# Patient Record
Sex: Female | Born: 1955 | Race: White | Hispanic: No | Marital: Married | Smoking: Never smoker
Health system: Southern US, Community
[De-identification: ages and names within clinical notes are randomized; demographics above are authoritative.]

## PROBLEM LIST (undated history)

## (undated) DIAGNOSIS — J45909 Unspecified asthma, uncomplicated: Secondary | ICD-10-CM

## (undated) DIAGNOSIS — G5793 Unspecified mononeuropathy of bilateral lower limbs: Secondary | ICD-10-CM

## (undated) DIAGNOSIS — M8580 Other specified disorders of bone density and structure, unspecified site: Secondary | ICD-10-CM

## (undated) DIAGNOSIS — T07XXXA Unspecified multiple injuries, initial encounter: Secondary | ICD-10-CM

## (undated) DIAGNOSIS — M0520 Rheumatoid vasculitis with rheumatoid arthritis of unspecified site: Secondary | ICD-10-CM

## (undated) HISTORY — DX: Unspecified multiple injuries, initial encounter: T07.XXXA

## (undated) HISTORY — DX: Unspecified mononeuropathy of bilateral lower limbs: G57.93

## (undated) HISTORY — DX: Other specified disorders of bone density and structure, unspecified site: M85.80

## (undated) HISTORY — DX: Unspecified asthma, uncomplicated: J45.909

## (undated) HISTORY — DX: Rheumatoid vasculitis with rheumatoid arthritis of unspecified site: M05.20

---

## 1999-09-28 ENCOUNTER — Other Ambulatory Visit: Admission: RE | Admit: 1999-09-28 | Discharge: 1999-09-28 | Payer: Self-pay | Admitting: Family Medicine

## 2003-02-19 ENCOUNTER — Other Ambulatory Visit: Admission: RE | Admit: 2003-02-19 | Discharge: 2003-02-19 | Payer: Self-pay | Admitting: Family Medicine

## 2004-04-22 ENCOUNTER — Ambulatory Visit: Payer: Self-pay | Admitting: Family Medicine

## 2004-11-02 ENCOUNTER — Ambulatory Visit: Payer: Self-pay | Admitting: Family Medicine

## 2005-03-03 ENCOUNTER — Ambulatory Visit: Payer: Self-pay | Admitting: Family Medicine

## 2014-07-06 DIAGNOSIS — M8580 Other specified disorders of bone density and structure, unspecified site: Secondary | ICD-10-CM

## 2014-07-06 HISTORY — DX: Other specified disorders of bone density and structure, unspecified site: M85.80

## 2014-11-07 DIAGNOSIS — K219 Gastro-esophageal reflux disease without esophagitis: Secondary | ICD-10-CM

## 2014-11-07 DIAGNOSIS — J4540 Moderate persistent asthma, uncomplicated: Secondary | ICD-10-CM | POA: Insufficient documentation

## 2014-11-07 DIAGNOSIS — J3089 Other allergic rhinitis: Secondary | ICD-10-CM | POA: Insufficient documentation

## 2014-12-17 ENCOUNTER — Encounter: Payer: Self-pay | Admitting: Allergy and Immunology

## 2014-12-17 ENCOUNTER — Ambulatory Visit (INDEPENDENT_AMBULATORY_CARE_PROVIDER_SITE_OTHER): Payer: BLUE CROSS/BLUE SHIELD | Admitting: Allergy and Immunology

## 2014-12-17 VITALS — BP 138/80 | HR 80 | Resp 24 | Ht 62.6 in | Wt 164.5 lb

## 2014-12-17 DIAGNOSIS — J3089 Other allergic rhinitis: Secondary | ICD-10-CM

## 2014-12-17 DIAGNOSIS — J4520 Mild intermittent asthma, uncomplicated: Secondary | ICD-10-CM | POA: Diagnosis not present

## 2014-12-17 DIAGNOSIS — K219 Gastro-esophageal reflux disease without esophagitis: Secondary | ICD-10-CM | POA: Diagnosis not present

## 2014-12-17 MED ORDER — OMEPRAZOLE 40 MG PO CPDR: DELAYED_RELEASE_CAPSULE | ORAL | Status: AC

## 2014-12-17 NOTE — Patient Instructions (Signed)
1. Continue omeprazole 40mg  one tablet one time per day  2. Continue Ventolin HFA two puffs every 4-6 hours if needed.  3. Continue action plan for asthma flare with Qvar 80 three inhalations three times per day.  4. Continue nasacort 1-2 sprays each nostril one time per day.

## 2014-12-17 NOTE — Progress Notes (Signed)
Ann Leon  Follow-up Note    Subjective:   Ann Leon is a 59 y.o. female who returns to the [facility] in re-evaluation of the following:  HPI Comments: Ann Leon returns stating that her asthma has been non existent the past year with minimal requirement for a SABA and no need to activate an action plan with Qvar. She has no problem with her nose while intermittently using Nasacort. Her reflux has been under good control with omeprazole 40 mg daily. She has been on prednisone for months for a flare of her RA and it sounds like she has stress fractures in her left foot. She is using a diphosphenate for her osteopenia. She did obtain the flu vaccine    Current outpatient prescriptions:  .  albuterol (VENTOLIN HFA) 108 (90 BASE) MCG/ACT inhaler, Inhale 2 puffs into the lungs every 4 (four) hours as needed (cough or wheeze)., Disp: , Rfl:  .  alendronate (FOSAMAX) 70 MG tablet, Take 70 mg by mouth once a week., Disp: , Rfl: 0 .  beclomethasone (QVAR) 80 MCG/ACT inhaler, Inhale 2 puffs into the lungs 2 (two) times daily., Disp: , Rfl:  .  CALCIUM-VITAMIN D PO, Take by mouth., Disp: , Rfl:  .  folic acid (FOLVITE) 967 MCG tablet, Take 400 mcg by mouth daily., Disp: , Rfl:  .  hydroxychloroquine (PLAQUENIL) 200 MG tablet, Take 300 mg by mouth daily., Disp: , Rfl:  .  omeprazole (PRILOSEC OTC) 20 MG tablet, Take 20 mg by mouth every morning., Disp: , Rfl:  .  predniSONE (DELTASONE) 5 MG tablet, Take 5 mg by mouth daily., Disp: , Rfl: 0 .  traMADol (ULTRAM) 50 MG tablet, take 1 tablet by mouth three times a day if needed for pain, Disp: , Rfl: 0 .  Triamcinolone Acetonide (NASACORT AQ NA), Place 2 sprays into the nose daily. , Disp: , Rfl:  .  InFLIXimab (REMICADE IV), Inject into the vein every 8 (eight) weeks., Disp: , Rfl:  .  omeprazole (PRILOSEC) 40 MG capsule, Take one capsule once daily before breakfast as directed.,  Disp: 30 capsule, Rfl: 11  Meds ordered this encounter  Medications  . omeprazole (PRILOSEC) 40 MG capsule    Sig: Take one capsule once daily before breakfast as directed.    Dispense:  30 capsule    Refill:  11    Past Medical History  Diagnosis Date  . Neuropathy of both feet (Ann Leon)   . Osteopenia may 2016  . Fractures     hairline fractures of foot  . Asthma     History reviewed. No pertinent past surgical history.  Allergies no known allergies  Review of Systems  Constitutional: Negative for fever, chills, weight loss and malaise/fatigue.  HENT: Negative for congestion, ear discharge, ear pain, hearing loss, nosebleeds, sore throat and tinnitus.   Eyes: Negative for blurred vision, pain, discharge and redness.  Respiratory: Negative for cough, hemoptysis, sputum production, shortness of breath, wheezing and stridor.   Cardiovascular: Negative for chest pain, palpitations and leg swelling.  Gastrointestinal: Negative for heartburn, nausea, vomiting and abdominal pain.  Musculoskeletal: Positive for joint pain. Negative for myalgias.  Skin: Negative for itching and rash.  Neurological: Negative for dizziness and headaches.     Objective:   Filed Vitals:   12/17/14 1533  BP: 138/80  Pulse: 80  Resp: 24    Physical Exam  Constitutional: She is well-developed, well-nourished, and  in no distress. No distress.  HENT:  Head: Normocephalic and atraumatic. Head is without right periorbital erythema and without left periorbital erythema.  Right Ear: Tympanic membrane, external ear and ear canal normal. No drainage. No foreign bodies. Tympanic membrane is not injected, not scarred, not perforated, not erythematous, not retracted and not bulging. No middle ear effusion.  Left Ear: Tympanic membrane, external ear and ear canal normal. No drainage. No foreign bodies. Tympanic membrane is not injected, not scarred, not perforated, not erythematous, not retracted and not bulging.   No middle ear effusion.  Nose: Nose normal. No mucosal edema, rhinorrhea, nose lacerations, sinus tenderness, nasal deformity, septal deviation or nasal septal hematoma. No epistaxis.  Mouth/Throat: Oropharynx is clear and moist and mucous membranes are normal. No oropharyngeal exudate, posterior oropharyngeal edema, posterior oropharyngeal erythema or tonsillar abscesses.  Eyes: Conjunctivae and lids are normal. Pupils are equal, round, and reactive to light. Right eye exhibits no discharge and no exudate. No foreign body present in the right eye. Left eye exhibits no discharge and no exudate. No foreign body present in the left eye. Right conjunctiva is not injected. Right conjunctiva has no hemorrhage. Left conjunctiva is not injected. Left conjunctiva has no hemorrhage. No scleral icterus.  Neck: No tracheal tenderness present. No tracheal deviation present. No thyromegaly present.  Cardiovascular: Normal rate, regular rhythm, S1 normal, S2 normal and normal heart sounds.  Exam reveals no gallop and no friction rub.   No murmur heard. Pulmonary/Chest: Effort normal. No respiratory distress. She has no wheezes. She has no rhonchi. She has no rales. She exhibits no tenderness.  Lymphadenopathy:    She has no cervical adenopathy.  Skin: No purpura and no rash noted. Rash is not macular, not maculopapular, not nodular, not pustular, not vesicular and not urticarial. She is not diaphoretic. No cyanosis or erythema. No pallor. Nails show no clubbing.  Psychiatric: Mood and affect normal.    Diagnostics:    Spirometry was performed and demonstrated an FEV1 of 1.99 at 84 % of predicted.  The patient had an Asthma Control Test with the following results: ACT Total Score: 23.    Assessment and Plan:   1. Mild intermittent asthma, uncomplicated   2. Gastroesophageal reflux disease, esophagitis presence not specified   3. Other allergic rhinitis      1. Continue omeprazole 40mg  one tablet one  time per day  2. Continue Ventolin HFA two puffs every 4-6 hours if needed.  3. Continue action plan for asthma flare with Qvar 80 three inhalations three times per day.  4. Continue nasacort 1-2 sprays each nostril one time per day.  Ann Leon has done so well that I don't think there is a reason for her to follow up in this clinic unless something unexpected occurs. She is certainly welcome to return in the future and she can have the remainder of her medical care delivered by Dr. Theodoro Kos, [place]

## 2015-11-12 DIAGNOSIS — E876 Hypokalemia: Secondary | ICD-10-CM | POA: Diagnosis not present

## 2015-11-12 DIAGNOSIS — C7B02 Secondary carcinoid tumors of liver: Secondary | ICD-10-CM | POA: Diagnosis not present

## 2015-11-12 DIAGNOSIS — M489 Spondylopathy, unspecified: Secondary | ICD-10-CM | POA: Diagnosis not present

## 2015-11-25 DIAGNOSIS — E340 Carcinoid syndrome: Secondary | ICD-10-CM | POA: Diagnosis not present

## 2015-11-25 DIAGNOSIS — C7B02 Secondary carcinoid tumors of liver: Secondary | ICD-10-CM | POA: Diagnosis not present

## 2016-03-17 DIAGNOSIS — E340 Carcinoid syndrome: Secondary | ICD-10-CM | POA: Diagnosis not present

## 2016-03-17 DIAGNOSIS — C7B02 Secondary carcinoid tumors of liver: Secondary | ICD-10-CM | POA: Diagnosis not present

## 2016-03-17 DIAGNOSIS — C7A00 Malignant carcinoid tumor of unspecified site: Secondary | ICD-10-CM | POA: Diagnosis not present

## 2016-07-07 DIAGNOSIS — C7B02 Secondary carcinoid tumors of liver: Secondary | ICD-10-CM | POA: Diagnosis not present

## 2016-07-07 DIAGNOSIS — E340 Carcinoid syndrome: Secondary | ICD-10-CM

## 2016-07-07 DIAGNOSIS — C7A00 Malignant carcinoid tumor of unspecified site: Secondary | ICD-10-CM | POA: Diagnosis not present

## 2016-09-29 DIAGNOSIS — M069 Rheumatoid arthritis, unspecified: Secondary | ICD-10-CM | POA: Diagnosis not present

## 2016-09-29 DIAGNOSIS — C7B03 Secondary carcinoid tumors of bone: Secondary | ICD-10-CM | POA: Diagnosis not present

## 2016-09-29 DIAGNOSIS — C7A00 Malignant carcinoid tumor of unspecified site: Secondary | ICD-10-CM | POA: Diagnosis not present

## 2016-09-29 DIAGNOSIS — C7B02 Secondary carcinoid tumors of liver: Secondary | ICD-10-CM | POA: Diagnosis not present

## 2016-09-29 DIAGNOSIS — Z79899 Other long term (current) drug therapy: Secondary | ICD-10-CM | POA: Diagnosis not present

## 2016-10-20 DIAGNOSIS — C7B02 Secondary carcinoid tumors of liver: Secondary | ICD-10-CM | POA: Diagnosis not present

## 2016-10-20 DIAGNOSIS — C7A00 Malignant carcinoid tumor of unspecified site: Secondary | ICD-10-CM | POA: Diagnosis not present

## 2016-10-20 DIAGNOSIS — C7B03 Secondary carcinoid tumors of bone: Secondary | ICD-10-CM | POA: Diagnosis not present

## 2016-12-21 DIAGNOSIS — E611 Iron deficiency: Secondary | ICD-10-CM | POA: Diagnosis not present

## 2016-12-21 DIAGNOSIS — J189 Pneumonia, unspecified organism: Secondary | ICD-10-CM | POA: Diagnosis not present

## 2016-12-21 DIAGNOSIS — M069 Rheumatoid arthritis, unspecified: Secondary | ICD-10-CM | POA: Diagnosis not present

## 2016-12-21 DIAGNOSIS — C7A00 Malignant carcinoid tumor of unspecified site: Secondary | ICD-10-CM | POA: Diagnosis not present

## 2016-12-21 DIAGNOSIS — C7B02 Secondary carcinoid tumors of liver: Secondary | ICD-10-CM | POA: Diagnosis not present

## 2017-01-12 DIAGNOSIS — M069 Rheumatoid arthritis, unspecified: Secondary | ICD-10-CM | POA: Diagnosis not present

## 2017-01-12 DIAGNOSIS — C7A00 Malignant carcinoid tumor of unspecified site: Secondary | ICD-10-CM | POA: Diagnosis not present

## 2017-01-12 DIAGNOSIS — C7B02 Secondary carcinoid tumors of liver: Secondary | ICD-10-CM | POA: Diagnosis not present

## 2017-02-23 DIAGNOSIS — C7B02 Secondary carcinoid tumors of liver: Secondary | ICD-10-CM | POA: Diagnosis not present

## 2017-02-23 DIAGNOSIS — C7A00 Malignant carcinoid tumor of unspecified site: Secondary | ICD-10-CM | POA: Diagnosis not present

## 2017-02-23 DIAGNOSIS — D509 Iron deficiency anemia, unspecified: Secondary | ICD-10-CM | POA: Diagnosis not present

## 2017-02-23 DIAGNOSIS — E876 Hypokalemia: Secondary | ICD-10-CM | POA: Diagnosis not present

## 2017-04-06 DIAGNOSIS — C7B02 Secondary carcinoid tumors of liver: Secondary | ICD-10-CM | POA: Diagnosis not present

## 2017-04-06 DIAGNOSIS — D509 Iron deficiency anemia, unspecified: Secondary | ICD-10-CM | POA: Diagnosis not present

## 2017-04-06 DIAGNOSIS — J019 Acute sinusitis, unspecified: Secondary | ICD-10-CM | POA: Diagnosis not present

## 2017-04-06 DIAGNOSIS — C7A00 Malignant carcinoid tumor of unspecified site: Secondary | ICD-10-CM | POA: Diagnosis not present

## 2017-04-06 DIAGNOSIS — M069 Rheumatoid arthritis, unspecified: Secondary | ICD-10-CM | POA: Diagnosis not present

## 2017-04-27 ENCOUNTER — Other Ambulatory Visit: Payer: Self-pay | Admitting: Oncology

## 2017-04-27 DIAGNOSIS — C7B02 Secondary carcinoid tumors of liver: Secondary | ICD-10-CM

## 2017-05-23 ENCOUNTER — Encounter (HOSPITAL_COMMUNITY): Payer: Self-pay

## 2017-05-23 ENCOUNTER — Encounter (HOSPITAL_COMMUNITY)
Admission: RE | Admit: 2017-05-23 | Discharge: 2017-05-23 | Disposition: A | Discharge status: Home / Self Care | Payer: 59 | Source: Ambulatory Visit | Attending: Oncology | Admitting: Oncology

## 2017-05-23 DIAGNOSIS — C7B02 Secondary carcinoid tumors of liver: Secondary | ICD-10-CM | POA: Diagnosis present

## 2017-05-23 MED ORDER — GALLIUM GA 68 DOTATATE IV KIT
3.4000 | PACK | Freq: Once | INTRAVENOUS | Status: AC
  Administered 2017-05-23: 3.4 via INTRAVENOUS

## 2017-05-25 DIAGNOSIS — D509 Iron deficiency anemia, unspecified: Secondary | ICD-10-CM | POA: Diagnosis not present

## 2017-05-25 DIAGNOSIS — M069 Rheumatoid arthritis, unspecified: Secondary | ICD-10-CM | POA: Diagnosis not present

## 2017-05-25 DIAGNOSIS — C7A00 Malignant carcinoid tumor of unspecified site: Secondary | ICD-10-CM | POA: Diagnosis not present

## 2017-05-25 DIAGNOSIS — C7B02 Secondary carcinoid tumors of liver: Secondary | ICD-10-CM | POA: Diagnosis not present

## 2017-06-13 ENCOUNTER — Other Ambulatory Visit (HOSPITAL_COMMUNITY): Payer: Self-pay | Admitting: Diagnostic Radiology

## 2017-06-13 ENCOUNTER — Ambulatory Visit (HOSPITAL_COMMUNITY)
Admission: RE | Admit: 2017-06-13 | Discharge: 2017-06-13 | Disposition: A | Discharge status: Home / Self Care | Payer: 59 | Source: Ambulatory Visit | Attending: Diagnostic Radiology | Admitting: Diagnostic Radiology

## 2017-06-13 DIAGNOSIS — C7A8 Other malignant neuroendocrine tumors: Secondary | ICD-10-CM

## 2017-06-13 NOTE — Consult Note (Signed)
Chief Complaint: Patient with metastatic neuroendocrine tumor was for  evaluation Peptide receptor radiotherapy (PRRT) with QV956 DOTATATE (Lutathera).  Referring Physician(s):McCarty, [facility]   Patient Status: [facility] - Out-pt  History of Present Illness:Very pleasant 62 year old female accompanied by 2 daughters for evaluation for peptide receptor radiotherapy.  Metastatic hepatic disease was discovered on ER for abdominal pain in  August 2017.  Subsequent hepatic biopsy demonstrated well differentiated neuroendocrine tumor.  Patient had no symptoms of diarrhea flushing at time of presentation.  No primary tumor identified.  Hepatic lesions positive octreotide scan.   Patient was initially started on 20 mg octreotide monthly in September 2017.  This is exam advanced to 30 mg every 3 weeks in July 2018 with tumor progression by MRI imaging..  Neuroendocrine tumor progressed moderately by MR imaging.  Patient was started on Afinitor 10 mg q.day.  Some adverse effects including pneumonia pneumonia.. Everolimus dosage change to 5 mg q.day.  Patient received DOTATATE FDG PET scan on 05/23/2017 which showed positive neuroendocrine tumor liver and retromandibular neck on the RIGHT.  Multiple bone metastasis additionally.  Additional history includes rheumatoid arthritis which patient received prednisone daily.  Past Medical History:  Diagnosis Date  . Asthma   . Fractures    hairline fractures of foot  . Neuropathy of both feet   . Osteopenia may 2016  . Rheumatoid arteritis     No past surgical history on file.  Allergies: Patient has no known allergies.  Medications: Prior to Admission medications   Medication Sig Start Date End Date Taking? Authorizing Provider  albuterol (VENTOLIN HFA) 108 (90 BASE) MCG/ACT inhaler Inhale 2 puffs into the lungs every 4 (four) hours as needed (cough or wheeze).    [provider]  alendronate (FOSAMAX) 70 MG tablet Take 70 mg  by mouth once a week. 12/01/14   [provider]  beclomethasone (QVAR) 80 MCG/ACT inhaler Inhale 2 puffs into the lungs 2 (two) times daily.    [provider]  CALCIUM-VITAMIN D PO Take by mouth.    [provider]  folic acid (FOLVITE) 387 MCG tablet Take 400 mcg by mouth daily.    [provider]  hydroxychloroquine (PLAQUENIL) 200 MG tablet Take 300 mg by mouth daily.    [provider]  InFLIXimab (REMICADE IV) Inject into the vein every 8 (eight) weeks.    [provider]  omeprazole (PRILOSEC OTC) 20 MG tablet Take 20 mg by mouth every morning.    [provider]  omeprazole (PRILOSEC) 40 MG capsule Take one capsule once daily before breakfast as directed. 12/17/14   Kozlow, Donnamarie Poag, MD  predniSONE (DELTASONE) 5 MG tablet Take 5 mg by mouth daily. 12/15/14   [provider]  traMADol (ULTRAM) 50 MG tablet take 1 tablet by mouth three times a day if needed for pain 12/16/14   [provider]  Triamcinolone Acetonide (NASACORT AQ NA) Place 2 sprays into the nose daily.     [provider]     No family history on file.  Social History   Socioeconomic History  . Marital status: Married    Spouse name: Not on file  . Number of children: Not on file  . Years of education: Not on file  . Highest education level: Not on file  Occupational History  . Not on file  Social Needs  . Financial resource strain: Not on file  . Food insecurity:    Worry: Not on file  Inability: Not on file  . Transportation needs:    Medical: Not on file    Non-medical: Not on file  Tobacco Use  . Smoking status: Never Smoker  . Smokeless tobacco: Never Used  Substance and Sexual Activity  . Alcohol use: No  . Drug use: Not on file  . Sexual activity: Not on file  Lifestyle  . Physical activity:    Days per week: Not on file    Minutes per session: Not on file  . Stress: Not on file  Relationships  . Social  connections:    Talks on phone: Not on file    Gets together: Not on file    Attends religious service: Not on file    Active member of club or organization: Not on file    Attends meetings of clubs or organizations: Not on file    Relationship status: Not on file  Other Topics Concern  . Not on file  Social History Narrative  . Not on file    ECOG Status: 1 - Symptomatic but completely ambulatory  Review of Systems: A 12 point ROS discussed and pertinent positives are indicated in the HPI above.  All other systems are negative.  Review of Systems  Constitutional: Positive for fatigue.  Musculoskeletal: Positive for arthralgias.  Skin: Positive for wound.       Thin skin on hands with ecchymosis   Hematological: Bruises/bleeds easily.    Vital Signs: There were no vitals taken for this visit.  Physical Exam  Constitutional: She appears well-developed.  HENT:  Head: Normocephalic and atraumatic.  Cardiovascular: Normal rate, regular rhythm, normal heart sounds and intact distal pulses.  Pulmonary/Chest: Effort normal and breath sounds normal.  Abdominal: Soft. Bowel sounds are normal.  Lymphadenopathy:    She has no cervical adenopathy.  Skin: Capillary refill takes less than 2 seconds.  Psychiatric: She has a normal mood and affect. Her behavior is normal. Judgment and thought content normal.    Imaging: Nm Pet (netspot Ga 68 Dotatate) Skull Base To Mid Thigh  Result Date: 05/24/2017 CLINICAL DATA:  Metastatic neuroendocrine tumor. Progression of liver metastasis on MRI 04/05/2017. EXAM: NUCLEAR MEDICINE PET SKULL BASE TO THIGH TECHNIQUE: 3.4 mCi Ga 52 DOTATATE was injected intravenously. Full-ring PET imaging was performed from the skull base to thigh after the radiotracer. CT data was obtained and used for attenuation correction and anatomic localization. COMPARISON:  Octreotide scan 12/07/2015, MRI abdomen 04/05/2017 FINDINGS: NECK Mass within the RIGHT parapharyngeal  space has intense radiotracer activity with SUV max equal 32. Mass measures 2.5 by 2.8 cm on image 16/4. Incidental CT findings: None CHEST No radiotracer accumulation within mediastinal or hilar lymph nodes. No suspicious pulmonary nodules on the CT scan. Incidental CT finding:None ABDOMEN/PELVIS Intense radiotracer activity within multiple round hepatic lesions. Large lesion in the RIGHT hepatic lobe measures 5.7 cm on image 96/4 with SUV max equal 39.3. Lesion in the central liver adjacent to the hepatic veins with SUV max equal 32.3 measures 17 mm on image 85/4. Solitary lesion in the LEFT lateral hepatic lobe measures 17 mm on image 105/4 with SUV max equal 33.3. Approximately 9 lesions in the liver. There is no abnormal radiotracer activity in the bowel. No abnormal activity within abdominopelvic lymph nodes. Physiologic activity noted in the liver, spleen, adrenal glands and kidneys. 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 Lesion within the RIGHT humerus head. Example lesions include RIGHT humeral head lesion with SUV max equal 10.7 LEFT transverse process lesion at C1 SUV max equal 13 Anteromedial rib lesions at the costochondral junction. T7 lesion with SUV max equal 14.8 T12 lesion with SUV max equal 23.4. S1 lesion with SUV max equal 7.0 These skeletal metastatic lesions have subtle sclerotic changes on the CT portion. Incidental CT findings:No pathologic fracture. IMPRESSION: 1. Intense radiotracer activity within liver metastatic lesions consistent with well differentiated neuroendocrine tumor. Evidence of progression on comparison MRI. 2. Metastatic mass within the RIGHT parapharyngeal space likely represents a large metastatic lymph node 3. Widespread intensely radiotracer avid skeletal metastasis.  4. No lesion identified within the bowel. Electronically Signed   By: Suzy Bouchard M.D.   On: 05/24/2017 09:04      Labs:  CBC: No results for input(s): WBC, HGB, HCT, PLT in the last 8760 hours.   Outside: 04/27/17 HCT 34.8, Hgb -= 11.6 WBC 8.4 Plt = 244    COAGS: No results for input(s): INR, APTT in the last 8760 hours.  BMP: 04/27/17 Creatinine 0.7 Billi = 0.7 Ablumin = 3.9   No results for input(s): NA, K, CL, CO2, GLUCOSE, BUN, CALCIUM, CREATININE, GFRNONAA, GFRAA in the last 8760 hours.  Invalid input(s): CMP  LIVER FUNCTION TESTS: No results for input(s): BILITOT, AST, ALT, ALKPHOS, PROT, ALBUMIN in the last 8760 hours.  TUMOR MARKERS: No results for input(s): AFPTM, CEA, CA199, CHROMOGRNA in the last 8760 hours.  Assessment and Plan:  Appropriate candidate for peptide receptor radiotherapy.  Patient has widespread metastatic well-differentiated neuroendocrine tumor including liver metastasis, bone metastasis, and retro mandibular mass on RIGHT neck.  Patient has minimal carcinoid symptoms denying diarrhea or flushing.  Patient does have significant symptomatic rheumatoid arthritis control by prednisone daily.  Patient's blood counts, liver function, and renal function are within normal limits and appropriate for peptide receptor radiotherapy.  Patient will receive her last dose IM  Sandostatin the first week of May may will be treated the third week of May (07/26/2017) with the first of 4 200 mCi Lu [place] (Lutathera) .  Doses will be spread apart by 2 months each.  Patient will receive a 30 mg IM injection after each dose Thera therapy.  Oncologists may wish to cease Affinitor therapy during treatment although this not necessarily required.   Patient was advised of the risks and benefits of the procedure including renal toxicity, hepatic toxicity, marrow suppression, carcinoid crisis and mild hair loss.  Benefits including greatly reduced risks of disease  progression and potential tumor shrinkage.  Radiation safety was also reviewed with patient.  Patient and family were agreeable to proceeding peptide receptor radiotherapy.    Thank you for this interesting consult.  I greatly enjoyed meeting Carmell Elgin and look forward to participating in their care.  A copy of this report was sent to the requesting provider on this date.  Electronically Signed: Rennis Golden, MD 06/13/2017, 2:15 PM    I spent a total of  30 Minutes   in face to face in clinical consultation, greater than 50% of which was counseling/coordinating care for metastatic neuroendocrine tumor.

## 2017-06-15 ENCOUNTER — Other Ambulatory Visit: Payer: Self-pay | Admitting: Oncology

## 2017-06-15 DIAGNOSIS — D3A8 Other benign neuroendocrine tumors: Secondary | ICD-10-CM

## 2017-07-06 DIAGNOSIS — D509 Iron deficiency anemia, unspecified: Secondary | ICD-10-CM | POA: Diagnosis not present

## 2017-07-06 DIAGNOSIS — C7A00 Malignant carcinoid tumor of unspecified site: Secondary | ICD-10-CM | POA: Diagnosis not present

## 2017-07-06 DIAGNOSIS — C7B02 Secondary carcinoid tumors of liver: Secondary | ICD-10-CM | POA: Diagnosis not present

## 2017-07-06 DIAGNOSIS — R944 Abnormal results of kidney function studies: Secondary | ICD-10-CM | POA: Diagnosis not present

## 2017-07-06 DIAGNOSIS — E860 Dehydration: Secondary | ICD-10-CM | POA: Diagnosis not present

## 2017-07-06 DIAGNOSIS — M069 Rheumatoid arthritis, unspecified: Secondary | ICD-10-CM | POA: Diagnosis not present

## 2017-07-06 DIAGNOSIS — R825 Elevated urine levels of drugs, medicaments and biological substances: Secondary | ICD-10-CM | POA: Diagnosis not present

## 2017-07-26 ENCOUNTER — Other Ambulatory Visit (HOSPITAL_COMMUNITY): Payer: 59

## 2017-07-26 ENCOUNTER — Ambulatory Visit (HOSPITAL_COMMUNITY)
Admission: RE | Admit: 2017-07-26 | Discharge: 2017-07-26 | Disposition: A | Discharge status: Home / Self Care | Payer: 59 | Source: Ambulatory Visit | Attending: Oncology | Admitting: Oncology

## 2017-07-26 DIAGNOSIS — C7B02 Secondary carcinoid tumors of liver: Secondary | ICD-10-CM | POA: Diagnosis present

## 2017-07-26 DIAGNOSIS — C7A8 Other malignant neuroendocrine tumors: Secondary | ICD-10-CM | POA: Insufficient documentation

## 2017-07-26 DIAGNOSIS — D3A8 Other benign neuroendocrine tumors: Secondary | ICD-10-CM

## 2017-07-26 LAB — COMPREHENSIVE METABOLIC PANEL
ALT: 34 U/L (ref 14–54)
ANION GAP: 12 (ref 5–15)
AST: 25 U/L (ref 15–41)
Albumin: 3.9 g/dL (ref 3.5–5.0)
Alkaline Phosphatase: 119 U/L (ref 38–126)
BILIRUBIN TOTAL: 0.8 mg/dL (ref 0.3–1.2)
BUN: 19 mg/dL (ref 6–20)
CO2: 24 mmol/L (ref 22–32)
Calcium: 9.2 mg/dL (ref 8.9–10.3)
Chloride: 93 mmol/L — ABNORMAL LOW (ref 101–111)
Creatinine, Ser: 0.97 mg/dL (ref 0.44–1.00)
Glucose, Bld: 588 mg/dL (ref 65–99)
POTASSIUM: 4.2 mmol/L (ref 3.5–5.1)
Sodium: 129 mmol/L — ABNORMAL LOW (ref 135–145)
TOTAL PROTEIN: 8.1 g/dL (ref 6.5–8.1)

## 2017-07-26 LAB — CBC WITH DIFFERENTIAL/PLATELET
Basophils Absolute: 0 10*3/uL (ref 0.0–0.1)
Basophils Relative: 0 %
Eosinophils Absolute: 0 10*3/uL (ref 0.0–0.7)
Eosinophils Relative: 0 %
HEMATOCRIT: 39.4 % (ref 36.0–46.0)
Hemoglobin: 13.2 g/dL (ref 12.0–15.0)
LYMPHS ABS: 1.9 10*3/uL (ref 0.7–4.0)
LYMPHS PCT: 20 %
MCH: 26.3 pg (ref 26.0–34.0)
MCHC: 33.5 g/dL (ref 30.0–36.0)
MCV: 78.6 fL (ref 78.0–100.0)
MONO ABS: 0.6 10*3/uL (ref 0.1–1.0)
MONOS PCT: 7 %
NEUTROS ABS: 6.9 10*3/uL (ref 1.7–7.7)
Neutrophils Relative %: 73 %
Platelets: 258 10*3/uL (ref 150–400)
RBC: 5.01 MIL/uL (ref 3.87–5.11)
RDW: 16 % — AB (ref 11.5–15.5)
WBC: 9.4 10*3/uL (ref 4.0–10.5)

## 2017-07-26 LAB — GLUCOSE, CAPILLARY: Glucose-Capillary: 331 mg/dL — ABNORMAL HIGH (ref 65–99)

## 2017-07-26 MED ORDER — LUTETIUM LU 177 DOTATATE 370 MBQ/ML IV SOLN: 200.0000 | Freq: Once | INTRAVENOUS | Status: DC

## 2017-07-26 MED ORDER — OCTREOTIDE ACETATE 500 MCG/ML IJ SOLN: 500.0000 ug | Freq: Once | INTRAMUSCULAR | Status: DC | PRN

## 2017-07-26 MED ORDER — OCTREOTIDE ACETATE 30 MG IM KIT
30.0000 mg | PACK | Freq: Once | INTRAMUSCULAR | Status: AC
  Administered 2017-07-26: 30 mg via INTRAMUSCULAR

## 2017-07-26 MED ORDER — ONDANSETRON HCL 8 MG PO TABS: 8.0000 mg | ORAL_TABLET | Freq: Two times a day (BID) | ORAL | 0 refills | Status: DC | PRN

## 2017-07-26 MED ORDER — INSULIN ASPART 100 UNIT/ML ~~LOC~~ SOLN
10.0000 [IU] | Freq: Once | SUBCUTANEOUS | Status: AC
  Administered 2017-07-26: 10 [IU] via SUBCUTANEOUS
  Filled 2017-07-26: qty 0.1

## 2017-07-26 MED ORDER — AMINO ACID RADIOPROTECTANT - L-LYSINE 2.5%/L-ARGININE 2.5% IN NS
250.0000 mL/h | INTRAVENOUS | Status: AC
  Administered 2017-07-26: 250 mL/h via INTRAVENOUS
  Filled 2017-07-26: qty 1000

## 2017-07-26 MED ORDER — OCTREOTIDE ACETATE 30 MG IM KIT
PACK | INTRAMUSCULAR | Status: AC
  Filled 2017-07-26: qty 1

## 2017-07-26 MED ORDER — OCTREOTIDE ACETATE 500 MCG/ML IJ SOLN
INTRAMUSCULAR | Status: AC
  Filled 2017-07-26: qty 1

## 2017-07-26 MED ORDER — SODIUM CHLORIDE 0.9 % IV SOLN: 500.0000 mL | Freq: Once | INTRAVENOUS | Status: DC

## 2017-07-26 MED ORDER — SODIUM CHLORIDE 0.9 % IV SOLN
8.0000 mg | Freq: Once | INTRAVENOUS | Status: AC
  Administered 2017-07-26: 8 mg via INTRAVENOUS
  Filled 2017-07-26: qty 4

## 2017-07-26 NOTE — Progress Notes (Signed)
Patient ID: Ann Leon, female   DOB: [birth year], 62 y.o.   MRN: [id number] Diagnosis: [Metastatic neuroendocrine tumor.]  Current Infusion: [1]  Planned Infusions: [4]  [203 ] mCi Lu 177DOTATATE   Patient presented to nuclear medicine for treatment. The patient's most recent blood counts, liver enzymes are renal function were reviewed and remains a good candidate to proceed with Lutathera.  Patient does not have carcinoid symptoms.  The patient was situated in an infusion suite and administered Lutathera as above. Patient will follow-up with referring oncologist for interval serum laboratories.  Patient was however found to have elevated glucose (588).  Patient was administered 10 units short-acting insulin subcu.  Patient's primary care physician Nodal, PA was contacted and patient will have a follow-up encounter tomorrow.  Elevated glucose likely relate to ongoing prednisone therapy.  Patient received 30 mg IM long-acting Sandostatin injection 4 hours after Lutathera effusion in the nuclear medicine department. . IMPRESSION:  [First] AJ 287 DOTATATE treatment for metastatic neuroendocrine tumor. The patient tolerated the infusion well.  Patient will follow-up with oncologists in 4 weeks for routine labs.  Patient may receive Sandostatin injection at that time.

## 2017-07-26 NOTE — Progress Notes (Signed)
CRITICAL VALUE ALERT  Critical Value:  Glucose 588  Date & Time Notied:  07/26/17 0930  Provider Notified: Dr. Leonia Reeves  Orders Received/Actions taken: none  - Keashia Haskins,RN

## 2017-09-20 ENCOUNTER — Other Ambulatory Visit (HOSPITAL_COMMUNITY): Payer: 59

## 2017-09-20 ENCOUNTER — Ambulatory Visit (HOSPITAL_COMMUNITY)
Admission: RE | Admit: 2017-09-20 | Discharge: 2017-09-20 | Disposition: A | Discharge status: Home / Self Care | Payer: 59 | Source: Ambulatory Visit | Attending: Oncology | Admitting: Oncology

## 2017-09-20 DIAGNOSIS — C7A8 Other malignant neuroendocrine tumors: Secondary | ICD-10-CM | POA: Diagnosis not present

## 2017-09-20 DIAGNOSIS — D3A8 Other benign neuroendocrine tumors: Secondary | ICD-10-CM

## 2017-09-20 LAB — CBC WITH DIFFERENTIAL/PLATELET
BASOS ABS: 0 10*3/uL (ref 0.0–0.1)
Basophils Relative: 0 %
Eosinophils Absolute: 0 10*3/uL (ref 0.0–0.7)
Eosinophils Relative: 0 %
HEMATOCRIT: 34.8 % — AB (ref 36.0–46.0)
Hemoglobin: 11.1 g/dL — ABNORMAL LOW (ref 12.0–15.0)
LYMPHS PCT: 24 %
Lymphs Abs: 2 10*3/uL (ref 0.7–4.0)
MCH: 27.1 pg (ref 26.0–34.0)
MCHC: 31.9 g/dL (ref 30.0–36.0)
MCV: 84.9 fL (ref 78.0–100.0)
MONO ABS: 0.8 10*3/uL (ref 0.1–1.0)
Monocytes Relative: 9 %
NEUTROS ABS: 5.6 10*3/uL (ref 1.7–7.7)
NEUTROS PCT: 67 %
Platelets: 427 10*3/uL — ABNORMAL HIGH (ref 150–400)
RBC: 4.1 MIL/uL (ref 3.87–5.11)
RDW: 17.7 % — AB (ref 11.5–15.5)
WBC: 8.4 10*3/uL (ref 4.0–10.5)

## 2017-09-20 LAB — COMPREHENSIVE METABOLIC PANEL
ALBUMIN: 3.3 g/dL — AB (ref 3.5–5.0)
ALT: 20 U/L (ref 0–44)
ANION GAP: 9 (ref 5–15)
AST: 16 U/L (ref 15–41)
Alkaline Phosphatase: 82 U/L (ref 38–126)
BILIRUBIN TOTAL: 0.7 mg/dL (ref 0.3–1.2)
BUN: 18 mg/dL (ref 6–20)
CO2: 29 mmol/L (ref 22–32)
Calcium: 8.7 mg/dL — ABNORMAL LOW (ref 8.9–10.3)
Chloride: 102 mmol/L (ref 98–111)
Creatinine, Ser: 0.73 mg/dL (ref 0.44–1.00)
GFR calc Af Amer: >60 mL/min (ref >60)
GFR calc non Af Amer: >60 mL/min (ref >60)
GLUCOSE: 158 mg/dL — AB (ref 70–99)
POTASSIUM: 3.8 mmol/L (ref 3.5–5.1)
Sodium: 140 mmol/L (ref 135–145)
Total Protein: 7.6 g/dL (ref 6.5–8.1)

## 2017-09-20 LAB — HEMOGLOBIN A1C
HEMOGLOBIN A1C: 8.1 % — AB (ref 4.8–5.6)
MEAN PLASMA GLUCOSE: 185.77 mg/dL

## 2017-09-20 MED ORDER — SODIUM CHLORIDE 0.9 % IV SOLN
500.0000 mL | Freq: Once | INTRAVENOUS | Status: DC
Start: 2017-09-20 — End: 2017-09-26

## 2017-09-20 MED ORDER — ONDANSETRON HCL 8 MG PO TABS: 8.0000 mg | ORAL_TABLET | Freq: Two times a day (BID) | ORAL | 0 refills | Status: DC | PRN

## 2017-09-20 MED ORDER — AMINO ACID RADIOPROTECTANT - L-LYSINE 2.5%/L-ARGININE 2.5% IN NS
250.0000 mL/h | INTRAVENOUS | Status: AC
  Administered 2017-09-20: 250 mL/h via INTRAVENOUS
  Filled 2017-09-20: qty 1000

## 2017-09-20 MED ORDER — OCTREOTIDE ACETATE 500 MCG/ML IJ SOLN
INTRAMUSCULAR | Status: AC
  Filled 2017-09-20: qty 1

## 2017-09-20 MED ORDER — LUTETIUM LU 177 DOTATATE 370 MBQ/ML IV SOLN
200.0000 | Freq: Once | INTRAVENOUS | Status: AC
  Administered 2017-09-20: 201.1 via INTRAVENOUS

## 2017-09-20 MED ORDER — OCTREOTIDE ACETATE 30 MG IM KIT
PACK | INTRAMUSCULAR | Status: AC
  Filled 2017-09-20: qty 1

## 2017-09-20 MED ORDER — OCTREOTIDE ACETATE 30 MG IM KIT
30.0000 mg | PACK | Freq: Once | INTRAMUSCULAR | Status: AC
  Administered 2017-09-20: 30 mg via INTRAMUSCULAR

## 2017-09-20 MED ORDER — SODIUM CHLORIDE 0.9 % IV SOLN
8.0000 mg | Freq: Once | INTRAVENOUS | Status: AC
  Administered 2017-09-20: 8 mg via INTRAVENOUS
  Filled 2017-09-20: qty 4

## 2017-09-20 MED ORDER — OCTREOTIDE ACETATE 500 MCG/ML IJ SOLN: 500.0000 ug | Freq: Once | INTRAMUSCULAR | Status: DC | PRN

## 2017-09-20 MED ORDER — PROCHLORPERAZINE EDISYLATE 10 MG/2ML IJ SOLN
10.0000 mg | Freq: Four times a day (QID) | INTRAMUSCULAR | Status: DC | PRN
  Filled 2017-09-20 (×2): qty 2

## 2017-09-20 NOTE — Progress Notes (Signed)
RADIOPHARMACEUTICALS:  [201.1] mCi Lu 177 DOTATATE  FINDINGS: Diagnosis: [Metastatic neuroendocrine tumor.]  Current Infusion: [Second]  Planned Infusions: [4]  Patient reports minimal symptoms following first therapy with only mild fatigue..  No diarrhea flushing.  Patient has continued joint pain presumed related to rheumatoid arthritis.  Patient's blood sugar as much improved in the interval.  Afinitor has been stopped.  The patient's most recent blood counts were reviewed and remains a good candidate to proceed with Lutathera.  Mild anemia hemoglobin 11.1 and increased platelets..  Renal function and liver function panel normal.  The patient was situated in an infusion suite and administered Lutathera as above. Patient will follow-up with referring oncologist for interval serum laboratories in 4 weeks (CBC and CMP).   Results for MULKI, ROESLER (MRN [id number]) as of 09/20/2017 11:18  Ref. Range 07/26/2017 08:34 09/20/2017 08:09  WBC Latest Ref Range: 4.0 - 10.5 K/uL 9.4 8.4  RBC Latest Ref Range: 3.87 - 5.11 MIL/uL 5.01 4.10  Hemoglobin Latest Ref Range: 12.0 - 15.0 g/dL 13.2 11.1 (L)  HCT Latest Ref Range: 36.0 - 46.0 % 39.4 34.8 (L)  MCV Latest Ref Range: 78.0 - 100.0 fL 78.6 84.9  MCH Latest Ref Range: 26.0 - 34.0 pg 26.3 27.1  MCHC Latest Ref Range: 30.0 - 36.0 g/dL 33.5 31.9  RDW Latest Ref Range: 11.5 - 15.5 % 16.0 (H) 17.7 (H)  Platelets Latest Ref Range: 150 - 400 K/uL 258 427 (H)  Neutrophils Latest Units: % 73 67  Lymphocytes Latest Units: % 20 24  Monocytes Relative Latest Units: % 7 9  Eosinophil Latest Units: % 0 0  Basophil Latest Units: % 0 0  NEUT# Latest Ref Range: 1.7 - 7.7 K/uL 6.9 5.6  Lymphocyte # Latest Ref Range: 0.7 - 4.0 K/uL 1.9 2.0  Monocyte # Latest Ref Range: 0.1 - 1.0 K/uL 0.6 0.8  Eosinophils Absolute Latest Ref Range: 0.0 - 0.7 K/uL 0.0 0.0  Basophils Absolute Latest Ref Range: 0.0 - 0.1 K/uL 0.0 0.0     Patient received 30 mg IM  long-acting Sandostatin injection 4 hours after Lutathera effusion in the nuclear medicine department.   IMPRESSION:  [Second] HQ 759 [id number] treatment for metastatic neuroendocrine tumor. The patient tolerated the infusion well. The patient will return in 8 weeks for ongoing care.

## 2017-10-19 DIAGNOSIS — M069 Rheumatoid arthritis, unspecified: Secondary | ICD-10-CM

## 2017-10-19 DIAGNOSIS — Z79899 Other long term (current) drug therapy: Secondary | ICD-10-CM

## 2017-10-19 DIAGNOSIS — C7A00 Malignant carcinoid tumor of unspecified site: Secondary | ICD-10-CM

## 2017-10-19 DIAGNOSIS — D649 Anemia, unspecified: Secondary | ICD-10-CM

## 2017-10-19 DIAGNOSIS — C7B02 Secondary carcinoid tumors of liver: Secondary | ICD-10-CM

## 2017-10-19 DIAGNOSIS — C7B03 Secondary carcinoid tumors of bone: Secondary | ICD-10-CM | POA: Diagnosis not present

## 2017-11-03 ENCOUNTER — Other Ambulatory Visit: Payer: Self-pay | Admitting: Oncology

## 2017-11-03 DIAGNOSIS — C7A098 Malignant carcinoid tumors of other sites: Secondary | ICD-10-CM

## 2017-11-13 NOTE — Progress Notes (Signed)
Oncology note from Dr.  Hinton Rao reviewed.  Patient doing well at interval evaluation. Lab values reviewed with only stable  Mild anemia.  Normal  hepatic and renal  Function.  Patient remains a good candidate for third Lutathera treatment with 200 mCi.  Dr. Mindi Junker will administer 3rd treatment on 11/15/17.  30 mg IV Sandostatin will be administered after treatment.  Will look forward to seeing Mrs. Salvato.

## 2017-11-14 ENCOUNTER — Other Ambulatory Visit: Payer: Self-pay | Admitting: Oncology

## 2017-11-14 DIAGNOSIS — C7A8 Other malignant neuroendocrine tumors: Secondary | ICD-10-CM

## 2017-11-15 ENCOUNTER — Other Ambulatory Visit (HOSPITAL_COMMUNITY): Payer: 59

## 2017-11-15 ENCOUNTER — Ambulatory Visit (HOSPITAL_COMMUNITY)
Admission: RE | Admit: 2017-11-15 | Discharge: 2017-11-15 | Disposition: A | Discharge status: Home / Self Care | Payer: 59 | Source: Ambulatory Visit | Attending: Oncology | Admitting: Oncology

## 2017-11-15 DIAGNOSIS — C7A098 Malignant carcinoid tumors of other sites: Secondary | ICD-10-CM | POA: Insufficient documentation

## 2017-11-15 LAB — CBC
HCT: 38.2 % (ref 36.0–46.0)
HEMOGLOBIN: 12.1 g/dL (ref 12.0–15.0)
MCH: 27.3 pg (ref 26.0–34.0)
MCHC: 31.7 g/dL (ref 30.0–36.0)
MCV: 86 fL (ref 78.0–100.0)
PLATELETS: 346 10*3/uL (ref 150–400)
RBC: 4.44 MIL/uL (ref 3.87–5.11)
RDW: 15.8 % — ABNORMAL HIGH (ref 11.5–15.5)
WBC: 7 10*3/uL (ref 4.0–10.5)

## 2017-11-15 LAB — COMPREHENSIVE METABOLIC PANEL
ALBUMIN: 3.4 g/dL — AB (ref 3.5–5.0)
ALT: 23 U/L (ref 0–44)
ANION GAP: 10 (ref 5–15)
AST: 17 U/L (ref 15–41)
Alkaline Phosphatase: 80 U/L (ref 38–126)
BUN: 18 mg/dL (ref 6–20)
CHLORIDE: 101 mmol/L (ref 98–111)
CO2: 32 mmol/L (ref 22–32)
Calcium: 9.4 mg/dL (ref 8.9–10.3)
Creatinine, Ser: 0.76 mg/dL (ref 0.44–1.00)
GFR calc non Af Amer: >60 mL/min (ref >60)
Glucose, Bld: 118 mg/dL — ABNORMAL HIGH (ref 70–99)
Potassium: 4.1 mmol/L (ref 3.5–5.1)
Sodium: 143 mmol/L (ref 135–145)
Total Bilirubin: 0.7 mg/dL (ref 0.3–1.2)
Total Protein: 7.5 g/dL (ref 6.5–8.1)

## 2017-11-15 MED ORDER — OCTREOTIDE ACETATE 500 MCG/ML IJ SOLN
INTRAMUSCULAR | Status: AC
  Filled 2017-11-15: qty 1

## 2017-11-15 MED ORDER — ONDANSETRON HCL 8 MG PO TABS: 8.0000 mg | ORAL_TABLET | Freq: Two times a day (BID) | ORAL | 0 refills | Status: DC | PRN

## 2017-11-15 MED ORDER — LUTETIUM LU 177 DOTATATE 370 MBQ/ML IV SOLN
200.0000 | Freq: Once | INTRAVENOUS | Status: AC
  Administered 2017-11-15: 201.6 via INTRAVENOUS

## 2017-11-15 MED ORDER — OCTREOTIDE ACETATE 30 MG IM KIT
PACK | INTRAMUSCULAR | Status: AC
  Filled 2017-11-15: qty 1

## 2017-11-15 MED ORDER — SODIUM CHLORIDE 0.9 % IV SOLN
8.0000 mg | Freq: Once | INTRAVENOUS | Status: AC
  Administered 2017-11-15: 8 mg via INTRAVENOUS
  Filled 2017-11-15: qty 4

## 2017-11-15 MED ORDER — AMINO ACID RADIOPROTECTANT - L-LYSINE 2.5%/L-ARGININE 2.5% IN NS
250.0000 mL/h | INTRAVENOUS | Status: AC
  Administered 2017-11-15: 250 mL/h via INTRAVENOUS
  Filled 2017-11-15: qty 1000

## 2017-11-15 MED ORDER — PROCHLORPERAZINE EDISYLATE 10 MG/2ML IJ SOLN
10.0000 mg | Freq: Four times a day (QID) | INTRAMUSCULAR | Status: DC | PRN
  Filled 2017-11-15: qty 2

## 2017-11-15 MED ORDER — OCTREOTIDE ACETATE 30 MG IM KIT
30.0000 mg | PACK | Freq: Once | INTRAMUSCULAR | Status: AC
  Administered 2017-11-15: 30 mg via INTRAMUSCULAR

## 2017-11-15 MED ORDER — OCTREOTIDE ACETATE 500 MCG/ML IJ SOLN: 500.0000 ug | Freq: Once | INTRAMUSCULAR | Status: DC | PRN

## 2017-11-15 MED ORDER — SODIUM CHLORIDE 0.9 % IV SOLN: 500.0000 mL | Freq: Once | INTRAVENOUS | Status: DC

## 2018-01-10 ENCOUNTER — Encounter (HOSPITAL_COMMUNITY)
Admission: RE | Admit: 2018-01-10 | Discharge: 2018-01-10 | Disposition: A | Discharge status: Home / Self Care | Payer: 59 | Source: Ambulatory Visit | Attending: Oncology | Admitting: Oncology

## 2018-01-10 ENCOUNTER — Other Ambulatory Visit (HOSPITAL_COMMUNITY): Payer: 59

## 2018-01-10 DIAGNOSIS — C7A098 Malignant carcinoid tumors of other sites: Secondary | ICD-10-CM | POA: Diagnosis not present

## 2018-01-10 LAB — HEMOGLOBIN A1C
HEMOGLOBIN A1C: 7.8 % — AB (ref 4.8–5.6)
Mean Plasma Glucose: 177.16 mg/dL

## 2018-01-10 LAB — COMPREHENSIVE METABOLIC PANEL
ALBUMIN: 3.8 g/dL (ref 3.5–5.0)
ALK PHOS: 76 U/L (ref 38–126)
ALT: 26 U/L (ref 0–44)
AST: 16 U/L (ref 15–41)
Anion gap: 10 (ref 5–15)
BILIRUBIN TOTAL: 0.6 mg/dL (ref 0.3–1.2)
BUN: 18 mg/dL (ref 8–23)
CHLORIDE: 100 mmol/L (ref 98–111)
CO2: 27 mmol/L (ref 22–32)
CREATININE: 0.86 mg/dL (ref 0.44–1.00)
Calcium: 9.1 mg/dL (ref 8.9–10.3)
GFR calc Af Amer: >60 mL/min (ref >60)
GLUCOSE: 147 mg/dL — AB (ref 70–99)
Potassium: 3.7 mmol/L (ref 3.5–5.1)
Sodium: 137 mmol/L (ref 135–145)
Total Protein: 7.6 g/dL (ref 6.5–8.1)

## 2018-01-10 LAB — CBC WITH DIFFERENTIAL/PLATELET
ABS IMMATURE GRANULOCYTES: 0.09 10*3/uL — AB (ref 0.00–0.07)
BASOS ABS: 0 10*3/uL (ref 0.0–0.1)
Basophils Relative: 0 %
Eosinophils Absolute: 0.1 10*3/uL (ref 0.0–0.5)
Eosinophils Relative: 1 %
HEMATOCRIT: 43.4 % (ref 36.0–46.0)
HEMOGLOBIN: 13.8 g/dL (ref 12.0–15.0)
Immature Granulocytes: 1 %
LYMPHS ABS: 1.2 10*3/uL (ref 0.7–4.0)
LYMPHS PCT: 13 %
MCH: 27.5 pg (ref 26.0–34.0)
MCHC: 31.8 g/dL (ref 30.0–36.0)
MCV: 86.5 fL (ref 80.0–100.0)
MONOS PCT: 10 %
Monocytes Absolute: 0.9 10*3/uL (ref 0.1–1.0)
NEUTROS PCT: 75 %
NRBC: 0 % (ref 0.0–0.2)
Neutro Abs: 6.8 10*3/uL (ref 1.7–7.7)
Platelets: 255 10*3/uL (ref 150–400)
RBC: 5.02 MIL/uL (ref 3.87–5.11)
RDW: 16.4 % — ABNORMAL HIGH (ref 11.5–15.5)
WBC: 9.1 10*3/uL (ref 4.0–10.5)

## 2018-01-10 MED ORDER — OCTREOTIDE ACETATE 500 MCG/ML IJ SOLN: 500.0000 ug | Freq: Once | INTRAMUSCULAR | Status: DC | PRN

## 2018-01-10 MED ORDER — LUTETIUM LU 177 DOTATATE 370 MBQ/ML IV SOLN
200.0000 | Freq: Once | INTRAVENOUS | Status: AC
  Administered 2018-01-10: 198 via INTRAVENOUS

## 2018-01-10 MED ORDER — AMINO ACID RADIOPROTECTANT - L-LYSINE 2.5%/L-ARGININE 2.5% IN NS
250.0000 mL/h | INTRAVENOUS | Status: AC
  Administered 2018-01-10: 250 mL/h via INTRAVENOUS
  Filled 2018-01-10: qty 1000

## 2018-01-10 MED ORDER — PROCHLORPERAZINE EDISYLATE 10 MG/2ML IJ SOLN
10.0000 mg | Freq: Four times a day (QID) | INTRAMUSCULAR | Status: DC | PRN
  Filled 2018-01-10: qty 2

## 2018-01-10 MED ORDER — OCTREOTIDE ACETATE 30 MG IM KIT
30.0000 mg | PACK | Freq: Once | INTRAMUSCULAR | Status: AC
  Administered 2018-01-10: 30 mg via INTRAMUSCULAR

## 2018-01-10 MED ORDER — SODIUM CHLORIDE 0.9 % IV SOLN
8.0000 mg | Freq: Once | INTRAVENOUS | Status: AC
  Administered 2018-01-10: 8 mg via INTRAVENOUS
  Filled 2018-01-10: qty 4

## 2018-01-10 MED ORDER — OCTREOTIDE ACETATE 500 MCG/ML IJ SOLN
INTRAMUSCULAR | Status: AC
  Filled 2018-01-10: qty 1

## 2018-01-10 MED ORDER — OCTREOTIDE ACETATE 30 MG IM KIT
PACK | INTRAMUSCULAR | Status: AC
  Filled 2018-01-10: qty 1

## 2018-01-10 MED ORDER — ONDANSETRON HCL 8 MG PO TABS: 8.0000 mg | ORAL_TABLET | Freq: Two times a day (BID) | ORAL | 0 refills | Status: AC | PRN

## 2018-01-10 MED ORDER — SODIUM CHLORIDE 0.9 % IV SOLN: 500.0000 mL | Freq: Once | INTRAVENOUS | Status: DC

## 2018-01-10 NOTE — Progress Notes (Signed)
CLINICAL DATA: [age] year[age]old [female] with metastatic neuroendocrine tumor. Well differentiated tumor with somatostatin receptor is identified within the [liver, mesentery, and skull base] by DOTATATE PET CT scan. EXAM: NUCLEAR MEDICINE LUTATHERA ADMINISTRATION TECHNIQUE: Infusion: The nuclear medicine technologist and I personally verified the dose activity ([200] mCi) to be delivered as specified in the written directive (200 mCi), and verified the patient identification via 2 separate methods. 20 gauge IV were started in the antecubital veins. Anti-emetics were administered by nursing staff. Amino acid renal protection was initiated 30 minutes prior to Lu 177 DOTATATE (Lutathera) infusion and continued continuously for 4 hours. Lutathera infusion was administered over 30 minutes.   The total administered dose was [198] mCi Lu 177 DOTATATE.  The entire IV tubing, venocatheter, stopcock and syringes was removed in total, placed in a disposal bag and sent for assay of the residual activity, which will be reported at a later time in our EMR by the physics staff. Pressure was applied to the venipuncture sites, and a compression bandage placed. Radiation Safety personnel were present to perform the discharge survey, as detailed on their documentation.  Patient received 30 mg IM long-acting Sandostatin injection 4 hours after Lutathera effusion in the nuclear medicine department.  RADIOPHARMACEUTICALS:  [198] mCi Lu 177 DOTATATE  FINDINGS: Diagnosis: [Metastatic neuroendocrine tumor.]  Current Infusion: [4]  Planned Infusions: [4]  Patient reports [minimal] interval symptoms following therapy.  Patient reports no diarrhea.  Patient has some knee pain relate arthritis which is slightly improved.  No bone pain.    The patient's most recent blood counts and CMP were reviewed and remains a good candidate to proceed with Lutathera.  Anemia is improved.  No leukocytosis or  thrombocytopenia.  The patient was situated in an infusion suite and administered Lutathera as above. Patient will follow-up with referring oncologist for interval serum laboratories (CBC and CMP) in approximately 4 weeks.  Recommend continued monitoring after therapy for delayed mild toxicity.   Patient received 30 mg IM long-acting Sandostatin injection 4 hours after Lutathera effusion in the nuclear medicine department.   IMPRESSION: [Fourth and final] QA 834 [id number] treatment for metastatic neuroendocrine tumor. The patient tolerated the infusion well. The patient will return in 5 weeks for gallium 4 DOTATATE PET scan and final consult.

## 2018-01-11 ENCOUNTER — Other Ambulatory Visit (HOSPITAL_COMMUNITY): Payer: 59

## 2018-02-08 DIAGNOSIS — C7B02 Secondary carcinoid tumors of liver: Secondary | ICD-10-CM

## 2018-02-08 DIAGNOSIS — C7A00 Malignant carcinoid tumor of unspecified site: Secondary | ICD-10-CM

## 2018-02-08 DIAGNOSIS — Z79899 Other long term (current) drug therapy: Secondary | ICD-10-CM

## 2018-02-08 DIAGNOSIS — C7B03 Secondary carcinoid tumors of bone: Secondary | ICD-10-CM | POA: Diagnosis not present

## 2018-02-08 DIAGNOSIS — M069 Rheumatoid arthritis, unspecified: Secondary | ICD-10-CM

## 2018-02-14 ENCOUNTER — Ambulatory Visit (HOSPITAL_COMMUNITY)
Admission: RE | Admit: 2018-02-14 | Discharge: 2018-02-14 | Disposition: A | Discharge status: Home / Self Care | Payer: 59 | Source: Ambulatory Visit | Attending: Oncology | Admitting: Oncology

## 2018-02-14 DIAGNOSIS — C7A8 Other malignant neuroendocrine tumors: Secondary | ICD-10-CM

## 2018-02-14 MED ORDER — GALLIUM GA 68 DOTATATE IV KIT
3.6000 | PACK | Freq: Once | INTRAVENOUS | Status: AC
  Administered 2018-02-14: 3.6 via INTRAVENOUS

## 2018-02-14 NOTE — Consult Note (Signed)
Chief Complaint: Patient with metastatic neuroendocrine tumor post completion of 4 cycles Peptide receptor radiotherapy (PRRT) with [id number] DOTATATE (Lutathera).  Referring Physician(s):   Patient Status: [facility] - Out-pt  History of Present Illness: Ann Leon is a very pleasant 62 y.o. female accompanied  Her out of town daughter. Pt with Metastatic hepatic disease was discovered on ER for abdominal pain in  August 2017.  Subsequent hepatic biopsy demonstrated well differentiated neuroendocrine tumor.  Patient had no symptoms of diarrhea flushing at time of presentation.  No primary tumor identified.  Hepatic lesions positive octreotide scan.   Patient was initially started on 20 mg octreotide monthly in September 2017.  This is exam advanced to 30 mg every 3 weeks in July 2018 with tumor progression by MRI imaging..  Neuroendocrine tumor progressed moderately by MR imaging.  Patient was started on Afinitor 10 mg q.day.  Some adverse effects including pneumonia pneumonia.. Everolimus dosage change to 5 mg q.day.  Patient received DOTATATE FDG PET scan on 05/23/2017 which showed positive neuroendocrine tumor liver and retromandibular neck on the RIGHT.  Multiple bone metastasis additionally.  Additional history includes rheumatoid arthritis which patient received prednisone daily.  Patient completed 4 cycles Lu 177 DOTATATE with final treatment 01/10/18.  Past Medical History:  Diagnosis Date  . Asthma   . Fractures    hairline fractures of foot  . Neuropathy of both feet   . Osteopenia may 2016  . Rheumatoid arteritis     No past surgical history on file.  Allergies: Patient has no known allergies.  Medications: Prior to Admission medications   Medication Sig Start Date End Date Taking? Authorizing Provider  albuterol (VENTOLIN HFA) 108 (90 BASE) MCG/ACT inhaler Inhale 2 puffs into the lungs every 4 (four) hours as needed (cough or wheeze).    [provider]  alendronate (FOSAMAX) 70 MG tablet Take 70 mg by mouth once a week. 12/01/14   [provider]  beclomethasone (QVAR) 80 MCG/ACT inhaler Inhale 2 puffs into the lungs 2 (two) times daily.    [provider]  CALCIUM-VITAMIN D PO Take by mouth.    [provider]  folic acid (FOLVITE) 403 MCG tablet Take 400 mcg by mouth daily.    [provider]  hydroxychloroquine (PLAQUENIL) 200 MG tablet Take 300 mg by mouth daily.    [provider]  InFLIXimab (REMICADE IV) Inject into the vein every 8 (eight) weeks.    [provider]  omeprazole (PRILOSEC OTC) 20 MG tablet Take 20 mg by mouth every morning.    [provider]  omeprazole (PRILOSEC) 40 MG capsule Take one capsule once daily before breakfast as directed. 12/17/14   Kozlow, Donnamarie Poag, MD  ondansetron (ZOFRAN) 8 MG tablet Take 1 tablet (8 mg total) by mouth 2 (two) times daily as needed for nausea or vomiting. 01/10/18   Gus Height, MD  predniSONE (DELTASONE) 5 MG tablet Take 5 mg by mouth daily. 12/15/14   [provider]  traMADol (ULTRAM) 50 MG tablet take 1 tablet by mouth three times a day if needed for pain 12/16/14   [provider]  Triamcinolone Acetonide (NASACORT AQ NA) Place 2 sprays into the nose daily.     [provider]     No family history on file.  Social History   Socioeconomic History  . Marital status: Married    Spouse name: Not on file  . Number of children: Not on file  .  Years of education: Not on file  . Highest education level: Not on file  Occupational History  . Not on file  Social Needs  . Financial resource strain: Not on file  . Food insecurity:    Worry: Not on file    Inability: Not on file  . Transportation needs:    Medical: Not on file    Non-medical: Not on file  Tobacco Use  . Smoking status: Never Smoker  . Smokeless tobacco: Never Used  Substance and Sexual Activity  . Alcohol use: No   . Drug use: Not on file  . Sexual activity: Not on file  Lifestyle  . Physical activity:    Days per week: Not on file    Minutes per session: Not on file  . Stress: Not on file  Relationships  . Social connections:    Talks on phone: Not on file    Gets together: Not on file    Attends religious service: Not on file    Active member of club or organization: Not on file    Attends meetings of clubs or organizations: Not on file    Relationship status: Not on file  Other Topics Concern  . Not on file  Social History Narrative  . Not on file    ECOG Status: 1 - Symptomatic but completely ambulatory  Review of Systems: A 12 point ROS discussed and pertinent positives are indicated in the HPI above.  All other systems are negative.  Review of Systems  Vital Signs: There were no vitals taken for this visit.  Physical Exam  Imaging:   Nm Pet (netspot Ga 57 Dotatate) Skull Base To Mid Thigh  Result Date: 02/14/2018 CLINICAL DATA:  Metastatic neuroendocrine tumor. Patient status post 4 cycles of peptide receptor radiotherapy (Lu 177 DOTATATE). Final treatment 01/10/2018. EXAM: NUCLEAR MEDICINE PET SKULL BASE TO THIGH TECHNIQUE: 3.6 mCi Ga 12 DOTATATE was injected intravenously. Full-ring PET imaging was performed from the skull base to thigh after the radiotracer. CT data was obtained and used for attenuation correction and anatomic localization. COMPARISON:  None. FINDINGS: NECK Large lesion remains at the RIGHT skull base just medial to the mandibular ramus and extending into the parapharyngeal space. Lesion measures 3.4 by 2.1 cm unchanged from 3.3 by 2.4 cm on prior remeasured. Lesion does have decreased radiotracer activity with SUV max equal 22.7 decreased from 32.9 Incidental CT findings: None CHEST No metabolic activity within mediastinal lymph nodes. No suspicious pulmonary nodules. Incidental CT finding:Multiple skeletal metastasis within the ribs and spine with decreased  activity described in the skeleton section. ABDOMEN/PELVIS Multiple lesions again demonstrated within the liver with intense radiotracer activity. Largest lesion in the RIGHT hepatic lobe is round measuring 6.2 cm compared with 5.6 cm. Lesion decreased in radiotracer activity with SUV max equal 29.6 compared SUV max equal 39.3. Similarly, lesion in the lateral LEFT hepatic lobe measures 1.7 cm compared with 1.7 cm with SUV max equal 23.8 compared to 33.3. Central lesion in the RIGHT hepatic lobe measuring SUV max equal 15.6 decreased from 32.3 Other smaller hepatic lesions with intense radiotracer activity all of which are decreased slightly from comparison exam. No new lesions present. No abnormal activity in the bowel. No retroperitoneal periportal adenopathy. Intense radiotracer activity within the spleen, kidneys, adrenal glands Physiologic activity noted in the liver, spleen, adrenal glands and kidneys. Incidental CT findings:None SKELETON Again demonstrated multiple foci of skeletal metastasis. All imaged lesions have decreased radiotracer activity. For example lesion in  the midshaft RIGHT femur with SUV max equal 22.6 decreased from 46.0. Lesion in the central sacrum with SUV max equal 6.0 decreased from 7.0. Lesion in the T12 vertebral body with SUV max equal 11.4 decreased from 23.8. Lesion in the midthoracic spine with SUV max equal 4.2 decreased from 14.8. Incidental CT findings:Stable sclerotic lesions sites of bone metastasis. Other lesions are occult. IMPRESSION: 1. As typical, there is significant residual radiotracer activity (somatostatin receptor activity) associated with the hepatic and skeletal metastasis; however, all lesion ahve decreased radiotracer activity suggesting a positive response. 2. Decreased somatostatin receptor radiotracer activity within hepatic metastasis. Activity reduced from 30 to 50%. No new lesions. 3. Decreased activity of all skeletal metastasis. No change in size.  Reduction from 50-75% activity. 4. Reduction in metabolic activity of soft tissue mass at the RIGHT skull base. No change in size. 5. No evidence of new or progressive disease. Electronically Signed   By: Suzy Bouchard M.D.   On: 02/14/2018 13:38   Nm Pet (netspot Ga 68 Dotatate) Skull Base To Mid Thigh  Result Date: 05/24/2017 CLINICAL DATA:  Metastatic neuroendocrine tumor. Progression of liver metastasis on MRI 04/05/2017. EXAM: NUCLEAR MEDICINE PET SKULL BASE TO THIGH TECHNIQUE: 3.4 mCi Ga 66 DOTATATE was injected intravenously. Full-ring PET imaging was performed from the skull base to thigh after the radiotracer. CT data was obtained and used for attenuation correction and anatomic localization. COMPARISON:  Octreotide scan 12/07/2015, MRI abdomen 04/05/2017 FINDINGS: NECK Mass within the RIGHT parapharyngeal space has intense radiotracer activity with SUV max equal 32. Mass measures 2.5 by 2.8 cm on image 16/4. Incidental CT findings: None CHEST No radiotracer accumulation within mediastinal or hilar lymph nodes. No suspicious pulmonary nodules on the CT scan. Incidental CT finding:None ABDOMEN/PELVIS Intense radiotracer activity within multiple round hepatic lesions. Large lesion in the RIGHT hepatic lobe measures 5.7 cm on image 96/4 with SUV max equal 39.3. Lesion in the central liver adjacent to the hepatic veins with SUV max equal 32.3 measures 17 mm on image 85/4. Solitary lesion in the LEFT lateral hepatic lobe measures 17 mm on image 105/4 with SUV max equal 33.3. Approximately 9 lesions in the liver. There is no abnormal radiotracer activity in the bowel. No abnormal activity within abdominopelvic lymph nodes. Physiologic activity noted in the liver, spleen, adrenal glands and kidneys. 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 Lesion within the RIGHT humerus head. Example lesions include RIGHT humeral head lesion with SUV max equal 10.7 LEFT transverse process lesion at C1 SUV max equal 13 Anteromedial rib lesions at the costochondral junction. T7 lesion with SUV max equal 14.8 T12 lesion with SUV max equal 23.4. S1 lesion with SUV max equal 7.0 These skeletal metastatic lesions have subtle sclerotic changes on the CT portion. Incidental CT findings:No pathologic fracture. IMPRESSION: 1. Intense radiotracer activity within liver metastatic lesions consistent with well differentiated neuroendocrine tumor. Evidence of progression on comparison MRI. 2. Metastatic mass within the RIGHT parapharyngeal space likely represents a large metastatic lymph node 3. Widespread intensely radiotracer avid skeletal metastasis. 4. No lesion identified within the bowel. Electronically Signed   By: Suzy Bouchard M.D.   On: 05/24/2017 09:04    Labs:  CBC: Recent Labs    07/26/17 0834 09/20/17 0809 11/15/17 0803 01/10/18 0750  WBC 9.4 8.4 7.0 9.1  HGB  13.2 11.1* 12.1 13.8  HCT 39.4 34.8* 38.2 43.4  PLT 258 427* 346 255    COAGS: No results for input(s): INR, APTT in the last 8760 hours.  BMP: Recent Labs    07/26/17 0834 09/20/17 0809 11/15/17 0803 01/10/18 0750  NA 129* 140 143 137  K 4.2 3.8 4.1 3.7  CL 93* 102 101 100  CO2 24 29 32 27  GLUCOSE 588* 158* 118* 147*  BUN 19 18 18 18   CALCIUM 9.2 8.7* 9.4 9.1  CREATININE 0.97 0.73 0.76 0.86  GFRNONAA >60 >60 >60 >60  GFRAA >60 >60 >60 >60    LIVER FUNCTION TESTS: Recent Labs    07/26/17 0834 09/20/17 0809 11/15/17 0803 01/10/18 0750  BILITOT 0.8 0.7 0.7 0.6  AST 25 16 17 16   ALT 34 20 23 26   ALKPHOS 119 82 80 76  PROT 8.1 7.6 7.5 7.6  ALBUMIN 3.9 3.3* 3.4* 3.8    TUMOR MARKERS: No results for input(s): AFPTM, CEA, CA199, CHROMOGRNA in the last 8760 hours.  Assessment and Plan:  [1.  Patient has completed peptide  receptor radiotherapy with 4 cycles of 741 millicuries Lu 287 DOTATATE.  Last treatment on 01/10/2018.  Patient tolerated procedure well with no renal toxicity, hepatic toxicity or marrow suppression.  Recommend continued surveillance of blood counts for delayed marrow suppression.] 2.  Primary goal of therapy is prolonged progression-free survival.  Patient has no evidence of progression on DOTATATE PET scan 02/13/18.  Additionally, patient had measurable decrease in receptor activity within hepatic tumors and skeletal tumors with reduction in activity ranging from 50-75%.  Of note, peptide receptor radiotherapy can have continued radio toxicity for up to 6 months following last therapy.  Consider follow-up DOTATATE PET scan or CT in 6-12 months.  Sooner follow-up if patient presents has worsening or progressive symptoms.  Consider following chromogranin A evels additionally.  3.  Patient reports no symptoms related to neuroendocrine tumor..  Patient has  continuing symptoms related to rheumatoid arthritis.  4.  Consider Sandostatin monthly injections although not necessarily indicated in asymptomatic patient..  5.  Patient advised to contact myself with subsequent questions or concern.    Thank you for this interesting consult.  I greatly enjoyed meeting Ann Leon and look forward to participating in their care.  A copy of this report was sent to the requesting provider on this date.  Electronically Signed: Rennis Golden, MD 02/14/2018, 3:20 PM   I spent a total of    25 Minutes in face to face in clinical consultation, greater than 50% of which was counseling/coordinating care for metastatic neuroendocrine tumor.

## 2018-06-07 DIAGNOSIS — C7B02 Secondary carcinoid tumors of liver: Secondary | ICD-10-CM | POA: Diagnosis not present

## 2018-06-07 DIAGNOSIS — C7B03 Secondary carcinoid tumors of bone: Secondary | ICD-10-CM | POA: Diagnosis not present

## 2018-06-07 DIAGNOSIS — C7A00 Malignant carcinoid tumor of unspecified site: Secondary | ICD-10-CM | POA: Diagnosis not present

## 2018-08-02 DIAGNOSIS — C7A00 Malignant carcinoid tumor of unspecified site: Secondary | ICD-10-CM

## 2018-08-02 DIAGNOSIS — C7B02 Secondary carcinoid tumors of liver: Secondary | ICD-10-CM

## 2018-08-02 DIAGNOSIS — C7B03 Secondary carcinoid tumors of bone: Secondary | ICD-10-CM

## 2018-09-27 DIAGNOSIS — C7951 Secondary malignant neoplasm of bone: Secondary | ICD-10-CM

## 2018-09-27 DIAGNOSIS — C7B02 Secondary carcinoid tumors of liver: Secondary | ICD-10-CM | POA: Diagnosis not present

## 2018-10-18 ENCOUNTER — Other Ambulatory Visit (HOSPITAL_COMMUNITY): Payer: Self-pay | Admitting: Oncology

## 2018-10-18 DIAGNOSIS — C7A8 Other malignant neuroendocrine tumors: Secondary | ICD-10-CM

## 2018-10-26 ENCOUNTER — Ambulatory Visit (HOSPITAL_COMMUNITY)
Admission: RE | Admit: 2018-10-26 | Discharge: 2018-10-26 | Disposition: A | Discharge status: Home / Self Care | Payer: 59 | Source: Ambulatory Visit | Attending: Oncology | Admitting: Oncology

## 2018-10-26 ENCOUNTER — Other Ambulatory Visit: Payer: Self-pay

## 2018-10-26 DIAGNOSIS — C7A8 Other malignant neuroendocrine tumors: Secondary | ICD-10-CM | POA: Diagnosis present

## 2018-10-26 MED ORDER — GALLIUM GA 68 DOTATATE IV KIT
3.3000 | PACK | Freq: Once | INTRAVENOUS | Status: AC | PRN
  Administered 2018-10-26: 3.3 via INTRAVENOUS

## 2018-11-01 DIAGNOSIS — C7B02 Secondary carcinoid tumors of liver: Secondary | ICD-10-CM | POA: Diagnosis not present

## 2018-11-01 DIAGNOSIS — C7951 Secondary malignant neoplasm of bone: Secondary | ICD-10-CM | POA: Diagnosis not present

## 2018-11-05 IMAGING — CT NM PET NOPR SKULL BASE TO THIGH
1 of 9 series · 1 of 25 positions shown · non-contrast
Comparison: Octreotide scan 12/07/2015, MRI abdomen 04/05/2017

CLINICAL DATA: Metastatic neuroendocrine tumor. Progression of
liver metastasis on MRI 04/05/2017.

EXAM:
NUCLEAR MEDICINE PET SKULL BASE TO THIGH
TECHNIQUE: 3.4 mCi Ga 68 DOTATATE was injected intravenously. Full-ring PET
imaging was performed from the skull base to thigh after the
radiotracer. CT data was obtained and used for attenuation
correction and anatomic localization.

[Series 4: ct sk_thigh 5.0 b31f · axial · 5.0mm · 0.98mm/px · 1 of 227 slices shown]
[im 227/227  brain]
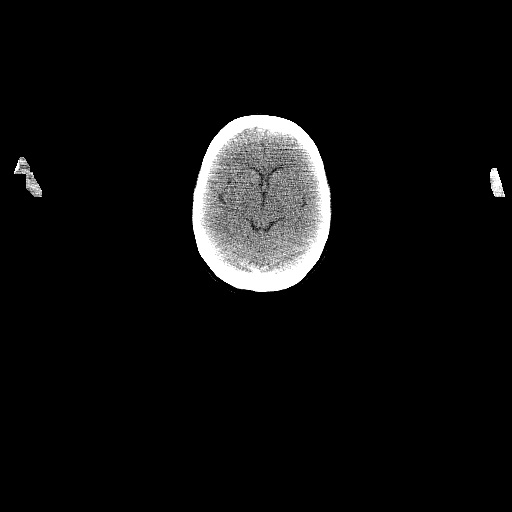

[1 of 25 positions shown; findings below may reference images not displayed]

FINDINGS: NECK

Mass within the RIGHT parapharyngeal space has intense radiotracer
activity with SUV max equal 32. Mass measures 2.5 by 2.8 cm on image
[DATE].

Incidental CT findings: None

CHEST

No radiotracer accumulation within mediastinal or hilar lymph nodes.
No suspicious pulmonary nodules on the CT scan.

Incidental CT finding:None

ABDOMEN/PELVIS

Intense radiotracer activity within multiple round hepatic lesions.

Large lesion in the RIGHT hepatic lobe measures 5.7 cm on image 96/4
with SUV max equal 39.3.

Lesion in the central liver adjacent to the hepatic veins with SUV
max equal 32.3 measures 17 mm on image 85/4.

Solitary lesion in the LEFT lateral hepatic lobe measures 17 mm on
image 105/4 with SUV max equal 33.3.

Approximately 9 lesions in the liver.

There is no abnormal radiotracer activity in the bowel. No abnormal
activity within abdominopelvic lymph nodes.

Physiologic activity noted in the liver, spleen, adrenal glands and
kidneys.

Incidental CT findings:Leiomyomas uterus without radiotracer
activity

SKELETON

Multiple discrete foci intense radiotracer activity in the skeleton
involving the sacrum, iliac bones, lumbar spine, thoracic spine ribs
as well as the cervical spine. Lesion within the RIGHT humerus head.

Example lesions include

RIGHT humeral head lesion with SUV max equal

LEFT transverse process lesion at C1 SUV max equal 13

Anteromedial rib lesions at the costochondral junction.

T7 lesion with SUV max equal

T12 lesion with SUV max equal 23.4.

S1 lesion with SUV max equal

These skeletal metastatic lesions have subtle sclerotic changes on
the CT portion.

Incidental CT findings:No pathologic fracture.
IMPRESSION: 1. Intense radiotracer activity within liver metastatic lesions
consistent with well differentiated neuroendocrine tumor. Evidence
of progression on comparison MRI.
2. Metastatic mass within the RIGHT parapharyngeal space likely
represents a large metastatic lymph node
3. Widespread intensely radiotracer avid skeletal metastasis.
4. No lesion identified within the bowel.

## 2019-04-24 DIAGNOSIS — C7B02 Secondary carcinoid tumors of liver: Secondary | ICD-10-CM

## 2019-05-15 ENCOUNTER — Other Ambulatory Visit: Payer: Self-pay | Admitting: Family Medicine

## 2019-10-11 ENCOUNTER — Other Ambulatory Visit (HOSPITAL_COMMUNITY): Payer: Self-pay | Admitting: Hematology and Oncology

## 2019-10-11 DIAGNOSIS — C7B02 Secondary carcinoid tumors of liver: Secondary | ICD-10-CM

## 2019-10-11 DIAGNOSIS — C7951 Secondary malignant neoplasm of bone: Secondary | ICD-10-CM

## 2019-10-17 DIAGNOSIS — C7B02 Secondary carcinoid tumors of liver: Secondary | ICD-10-CM | POA: Diagnosis not present

## 2019-10-28 ENCOUNTER — Ambulatory Visit (HOSPITAL_COMMUNITY): Admission: RE | Admit: 2019-10-28 | Payer: BLUE CROSS/BLUE SHIELD | Source: Ambulatory Visit

## 2019-10-28 ENCOUNTER — Encounter (HOSPITAL_COMMUNITY): Payer: Self-pay

## 2019-11-24 ENCOUNTER — Encounter: Payer: Self-pay | Admitting: Oncology

## 2019-11-24 DIAGNOSIS — C7B02 Secondary carcinoid tumors of liver: Secondary | ICD-10-CM | POA: Insufficient documentation

## 2019-12-02 ENCOUNTER — Encounter: Payer: Self-pay | Admitting: Pharmacist

## 2019-12-12 ENCOUNTER — Other Ambulatory Visit: Payer: Self-pay

## 2019-12-12 ENCOUNTER — Inpatient Hospital Stay: Payer: BLUE CROSS/BLUE SHIELD | Attending: Oncology

## 2019-12-12 VITALS — BP 167/79 | HR 77 | Temp 98.2°F

## 2019-12-12 DIAGNOSIS — C7B02 Secondary carcinoid tumors of liver: Secondary | ICD-10-CM | POA: Insufficient documentation

## 2019-12-12 DIAGNOSIS — C7B03 Secondary carcinoid tumors of bone: Secondary | ICD-10-CM | POA: Insufficient documentation

## 2019-12-12 DIAGNOSIS — C7A00 Malignant carcinoid tumor of unspecified site: Secondary | ICD-10-CM | POA: Diagnosis not present

## 2019-12-12 MED ORDER — OCTREOTIDE ACETATE 30 MG IM KIT
PACK | INTRAMUSCULAR | Status: AC
  Filled 2019-12-12: qty 1

## 2019-12-12 MED ORDER — OCTREOTIDE ACETATE 30 MG IM KIT
30.0000 mg | PACK | Freq: Once | INTRAMUSCULAR | Status: AC
  Administered 2019-12-12: 30 mg via INTRAMUSCULAR

## 2019-12-12 NOTE — Progress Notes (Signed)
PT stable at time of discharge

## 2019-12-12 NOTE — Patient Instructions (Signed)
Octreotide injection solution What is this medicine? OCTREOTIDE (ok TREE oh tide) is used to reduce blood levels of growth hormone in patients with a condition called acromegaly. This medicine also reduces flushing and watery diarrhea caused by certain types of cancer. This medicine may be used for other purposes; ask your health care provider or pharmacist if you have questions. COMMON BRAND NAME(S): Bynfezia, Sandostatin What should I tell my health care provider before I take this medicine? They need to know if you have any of these conditions:  diabetes  gallbladder disease  kidney disease  liver disease  thyroid disease  an unusual or allergic reaction to octreotide, other medicines, foods, dyes, or preservatives  pregnant or trying to get pregnant  breast-feeding How should I use this medicine? This medicine is for injection under the skin or into a vein (only in emergency situations). It is usually given by a health care professional in a hospital or clinic setting. If you get this medicine at home, you will be taught how to prepare and give this medicine. Allow the injection solution to come to room temperature before use. Do not warm it artificially. Use exactly as directed. Take your medicine at regular intervals. Do not take your medicine more often than directed. It is important that you put your used needles and syringes in a special sharps container. Do not put them in a trash can. If you do not have a sharps container, call your pharmacist or healthcare provider to get one. Talk to your pediatrician regarding the use of this medicine in children. Special care may be needed. Overdosage: If you think you have taken too much of this medicine contact a poison control center or emergency room at once. NOTE: This medicine is only for you. Do not share this medicine with others. What if I miss a dose? If you miss a dose, take it as soon as you can. If it is almost time for your  next dose, take only that dose. Do not take double or extra doses. What may interact with this medicine?  bromocriptine  certain medicines for blood pressure, heart disease, irregular heartbeat  cyclosporine  diuretics  medicines for diabetes, including insulin  quinidine This list may not describe all possible interactions. Give your health care provider a list of all the medicines, herbs, non-prescription drugs, or dietary supplements you use. Also tell them if you smoke, drink alcohol, or use illegal drugs. Some items may interact with your medicine. What should I watch for while using this medicine? Visit your doctor or health care professional for regular checks on your progress. To help reduce irritation at the injection site, use a different site for each injection and make sure the solution is at room temperature before use. This medicine may cause decreases in blood sugar. Signs of low blood sugar include chills, cool, pale skin or cold sweats, drowsiness, extreme hunger, fast heartbeat, headache, nausea, nervousness or anxiety, shakiness, trembling, unsteadiness, tiredness, or weakness. Contact your doctor or health care professional right away if you experience any of these symptoms. This medicine may increase blood sugar. Ask your healthcare provider if changes in diet or medicines are needed if you have diabetes. This medicine may cause a decrease in vitamin B12. You should make sure that you get enough vitamin B12 while you are taking this medicine. Discuss the foods you eat and the vitamins you take with your health care professional. What side effects may I notice from receiving this medicine? Side   effects that you should report to your doctor or health care professional as soon as possible:  allergic reactions like skin rash, itching or hives, swelling of the face, lips, or tongue  fast, slow, or irregular heartbeat  right upper belly pain  severe stomach pain  signs  and symptoms of high blood sugar such as being more thirsty or hungry or having to urinate more than normal. You may also feel very tired or have blurry vision.  signs and symptoms of low blood sugar such as feeling anxious; confusion; dizziness; increased hunger; unusually weak or tired; increased sweating; shakiness; cold, clammy skin; irritable; headache; blurred vision; fast heartbeat; loss of consciousness  unusually weak or tired Side effects that usually do not require medical attention (report to your doctor or health care professional if they continue or are bothersome):  diarrhea  dizziness  gas  headache  nausea, vomiting  pain, redness, or irritation at site where injected  upset stomach This list may not describe all possible side effects. Call your doctor for medical advice about side effects. You may report side effects to FDA at 1-800-FDA-1088. Where should I keep my medicine? Keep out of the reach of children. Store in a refrigerator between 2 and 8 degrees C (36 and 46 degrees F). Protect from light. Allow to come to room temperature naturally. Do not use artificial heat. If protected from light, the injection may be stored at room temperature between 20 and 30 degrees C (70 and 86 degrees F) for 14 days. After the initial use, throw away any unused portion of a multiple dose vial after 14 days. Throw away unused portions of the ampules after use. NOTE: This sheet is a summary. It may not cover all possible information. If you have questions about this medicine, talk to your doctor, pharmacist, or health care provider.  2020 Elsevier/Gold Standard (2018-09-20 13:33:09)  

## 2020-01-09 ENCOUNTER — Ambulatory Visit: Payer: BLUE CROSS/BLUE SHIELD

## 2020-01-28 NOTE — Progress Notes (Signed)
[facility] has changed [facility] due to [facility] insurance. She was calling to find out what I have been using for her co-pay assistance. I let her know that she was enrolled into the [employer] Co-Pay Card. She can have the information changed to her new provider, suggested she speak with the financial counselor or social worker at her new providers office.

## 2020-02-20 ENCOUNTER — Other Ambulatory Visit: Payer: BLUE CROSS/BLUE SHIELD

## 2020-02-20 ENCOUNTER — Ambulatory Visit: Payer: BLUE CROSS/BLUE SHIELD | Admitting: Oncology

## 2020-02-20 ENCOUNTER — Ambulatory Visit: Payer: BLUE CROSS/BLUE SHIELD

## 2020-02-24 NOTE — Progress Notes (Signed)
Patient let me know that her new facility did not know about the sandostatin co-pay cards. She was asking for any help that I could provide. I re-enrolled her into the co-pay program with [employer] for Sandostatin. Sent patient an email with all of the information for co-pay card, will also mail to her home address. Let her know to call me if she needs anymore assistance with this. She was also asking me about her bill from her last treatment. Her insurance did not cover and she owes 12,000 dollars. I told her I would ask about this, spoke with Butch Penny in authorizations and she will check on it. Will get back in touch with patient once I know more.

## 2020-03-12 NOTE — Progress Notes (Signed)
Patient does owe 12000+ from the 12/12/19 DOS. She completed a financial hardship application and I sent this to [employer] in Patient Accounting today.
